# Patient Record
Sex: Female | Born: 1975 | Race: Black or African American | Hispanic: No | Marital: Single | Smoking: Never smoker
Health system: Southern US, Community
[De-identification: ages and names within clinical notes are randomized; demographics above are authoritative.]

## PROBLEM LIST (undated history)

## (undated) DIAGNOSIS — I10 Essential (primary) hypertension: Secondary | ICD-10-CM

## (undated) DIAGNOSIS — C541 Malignant neoplasm of endometrium: Secondary | ICD-10-CM

## (undated) HISTORY — PX: TONSILLECTOMY: SUR1361

## (undated) HISTORY — PX: ABDOMINAL HYSTERECTOMY: SHX81

---

## 2000-02-10 ENCOUNTER — Emergency Department (HOSPITAL_COMMUNITY): Admission: EM | Admit: 2000-02-10 | Discharge: 2000-02-10 | Payer: Self-pay | Admitting: Internal Medicine

## 2000-02-10 ENCOUNTER — Encounter: Payer: Self-pay | Admitting: Internal Medicine

## 2004-03-21 ENCOUNTER — Emergency Department: Payer: Self-pay | Admitting: Emergency Medicine

## 2006-01-01 ENCOUNTER — Emergency Department: Payer: Self-pay | Admitting: Emergency Medicine

## 2006-03-21 ENCOUNTER — Emergency Department: Payer: Self-pay | Admitting: General Practice

## 2008-09-11 ENCOUNTER — Emergency Department: Payer: Self-pay | Admitting: Unknown Physician Specialty

## 2010-08-02 ENCOUNTER — Emergency Department: Payer: Self-pay | Admitting: Unknown Physician Specialty

## 2015-02-23 ENCOUNTER — Emergency Department (HOSPITAL_COMMUNITY)
Admission: EM | Admit: 2015-02-23 | Discharge: 2015-02-23 | Disposition: A | Discharge status: Home / Self Care | Payer: BLUE CROSS/BLUE SHIELD | Attending: Emergency Medicine | Admitting: Emergency Medicine

## 2015-02-23 ENCOUNTER — Encounter (HOSPITAL_COMMUNITY): Payer: Self-pay | Admitting: Emergency Medicine

## 2015-02-23 DIAGNOSIS — X58XXXA Exposure to other specified factors, initial encounter: Secondary | ICD-10-CM | POA: Insufficient documentation

## 2015-02-23 DIAGNOSIS — I10 Essential (primary) hypertension: Secondary | ICD-10-CM | POA: Diagnosis not present

## 2015-02-23 DIAGNOSIS — T7840XA Allergy, unspecified, initial encounter: Secondary | ICD-10-CM | POA: Diagnosis not present

## 2015-02-23 DIAGNOSIS — Y998 Other external cause status: Secondary | ICD-10-CM | POA: Diagnosis not present

## 2015-02-23 DIAGNOSIS — R1110 Vomiting, unspecified: Secondary | ICD-10-CM | POA: Insufficient documentation

## 2015-02-23 DIAGNOSIS — Y9289 Other specified places as the place of occurrence of the external cause: Secondary | ICD-10-CM | POA: Diagnosis not present

## 2015-02-23 DIAGNOSIS — F419 Anxiety disorder, unspecified: Secondary | ICD-10-CM | POA: Insufficient documentation

## 2015-02-23 DIAGNOSIS — Y9389 Activity, other specified: Secondary | ICD-10-CM | POA: Insufficient documentation

## 2015-02-23 DIAGNOSIS — Z8542 Personal history of malignant neoplasm of other parts of uterus: Secondary | ICD-10-CM | POA: Insufficient documentation

## 2015-02-23 HISTORY — DX: Essential (primary) hypertension: I10

## 2015-02-23 HISTORY — DX: Malignant neoplasm of endometrium: C54.1

## 2015-02-23 MED ORDER — FAMOTIDINE IN NACL 20-0.9 MG/50ML-% IV SOLN
20.0000 mg | Freq: Once | INTRAVENOUS | Status: AC
  Administered 2015-02-23: 20 mg via INTRAVENOUS
  Filled 2015-02-23: qty 50

## 2015-02-23 MED ORDER — DIPHENHYDRAMINE HCL 50 MG/ML IJ SOLN
50.0000 mg | Freq: Once | INTRAMUSCULAR | Status: AC
  Administered 2015-02-23: 50 mg via INTRAMUSCULAR
  Filled 2015-02-23: qty 1

## 2015-02-23 MED ORDER — EPINEPHRINE 0.3 MG/0.3ML IJ SOAJ
0.3000 mg | Freq: Once | INTRAMUSCULAR | Status: AC
  Administered 2015-02-23: 0.3 mg via SUBCUTANEOUS
  Filled 2015-02-23: qty 0.3

## 2015-02-23 MED ORDER — DIPHENHYDRAMINE HCL 50 MG/ML IJ SOLN: 50.0000 mg | Freq: Once | INTRAMUSCULAR | Status: DC

## 2015-02-23 NOTE — ED Notes (Signed)
IV attempted x2 with US guidance. Unsuccessful because of patient inability to remain still for procedure. MD Steinl informed.

## 2015-02-23 NOTE — ED Notes (Signed)
Patient is allergic to jalapeno's, ate a jalapeno and feels "like her throat is closing up".  Patient is only able to speak with a whisper and having emesis.

## 2015-02-23 NOTE — ED Provider Notes (Addendum)
CSN: [id number]     Arrival date & time 02/23/15  1850 History   First MD Initiated Contact with Patient 02/23/15 1858     Chief Complaint  Patient presents with  . Allergic Reaction  . Emesis     (Consider location/radiation/quality/duration/timing/severity/associated sxs/prior Treatment) Patient is a 39 y.o. female presenting with allergic reaction and vomiting. The history is provided by the patient.  Allergic Reaction Presenting symptoms: no rash   Emesis Associated symptoms: no abdominal pain, no chills, no diarrhea, no headaches and no sore throat   Patient indicates she feels as though she is having an allergic reaction. States was eating at a [employer], and was eating a sauce and thinks there was jalapeno in the sauce, which she has been allergic to in the past.  Pt indicates shortly afterwards, felt as if swelling in throat. Pt is speaking in a whisper. No stridor or wheezing heard. No rash/hives or itching. No chest pain or sob.  Prior to onset symptoms, states was in normal state of health, has been asymptomatic recently. No throat pain or fevers. No uri c/o.       Past Medical History  Diagnosis Date  . Hypertension   . Endometrial cancer [facility])    Past Surgical History  Procedure Laterality Date  . Tonsillectomy    . Abdominal hysterectomy     No family history on file. Social History  Substance Use Topics  . Smoking status: Never Smoker   . Smokeless tobacco: None  . Alcohol Use: No   OB History    No data available     Review of Systems  Constitutional: Negative for fever and chills.  HENT: Negative for sore throat.   Eyes: Negative for redness.  Respiratory: Negative for cough and shortness of breath.   Cardiovascular: Negative for chest pain and palpitations.  Gastrointestinal: Positive for vomiting. Negative for abdominal pain and diarrhea.  Genitourinary: Negative for flank pain.  Musculoskeletal: Negative for back pain and neck  pain.  Skin: Negative for rash.  Neurological: Negative for syncope and headaches.  Hematological: Does not bruise/bleed easily.  Psychiatric/Behavioral: Negative for confusion.      Allergies  Review of patient's allergies indicates not on file.  Home Medications   Prior to Admission medications   Not on File   BP 178/95 mmHg  Pulse 105  Temp(Src) 98.3 F (36.8 C) (Oral)  Resp 14  SpO2 100% Physical Exam  Constitutional: She is oriented to person, place, and time. She appears well-developed and well-nourished. No distress.  HENT:  Mouth/Throat: Oropharynx is clear and moist.  No lip, tongue, or pharyngeal edema.   Eyes: Conjunctivae are normal. No scleral icterus.  Neck: Neck supple. No tracheal deviation present.  Cardiovascular: Normal rate, regular rhythm, normal heart sounds and intact distal pulses.  Exam reveals no gallop and no friction rub.   No murmur heard. Pulmonary/Chest: Effort normal and breath sounds normal. No respiratory distress.  Abdominal: Soft. Normal appearance and bowel sounds are normal. She exhibits no distension. There is no tenderness.  obese  Genitourinary:  No cva tenderness  Musculoskeletal: She exhibits no edema.  Neurological: She is alert and oriented to person, place, and time.  Skin: Skin is warm and dry. No rash noted.  Psychiatric:  Mildly anxious  Nursing note and vitals reviewed.   ED Course  Procedures (including critical care time) Labs Review     I have personally reviewed and evaluated these images and lab results as part  of my medical decision-making.   EKG Interpretation   Date/Time:  Monday February 23 2015 19:02:15 EST Ventricular Rate:  109 PR Interval:  172 QRS Duration: 85 QT Interval:  335 QTC Calculation: 451 R Axis:   5 Text Interpretation:  Sinus tachycardia No previous tracing Confirmed by  Ashok Cordia  MD, Lennette Bihari ([id number]) on 02/23/2015 7:07:45 PM      MDM   Reviewed nursing notes and prior charts  for additional history.   Benadryl iv. pepcid iv.  Pt indicates allergy reaction to steroids/pred.   Nurses/staff trying to establish iv access. Given sense throat swelling will give epi subcut/IM.    Recheck - pts symptoms completely resolved.  No wheezing, no stridor, no whispering.   Pt drinking fluids. No c/o, remains asymptomatic.  Pt currently appears stable for d/c.      Lajean Saver, MD 02/23/15 2127

## 2015-02-23 NOTE — Discharge Instructions (Signed)
It was our pleasure to provide your ER care today - we hope that you feel better.  Rest. Drink adequate fluids.  In the event of severe allergic reaction (i.e. Throat closing, unable to breath, about to faint) - use your epipen, take benadryl 50 mg, and seek medical attention immediately.  Follow up with primary care doctor in the next few days.  Return to ER if worse, symptoms recur, trouble breathing, throat closing, other concern.  You were given medication in the ER that causes drowsiness, no driving for the next 4 hours.        Anaphylactic Reaction An anaphylactic reaction is a sudden, severe allergic reaction that involves the whole body. It can be life threatening. A hospital stay is often required. People with asthma, eczema, or hay fever are slightly more likely to have an anaphylactic reaction. CAUSES  An anaphylactic reaction may be caused by anything to which you are allergic. After being exposed to the allergic substance, your immune system becomes sensitized to it. When you are exposed to that allergic substance again, an allergic reaction can occur. Common causes of an anaphylactic reaction include:  Medicines.  Foods, especially peanuts, wheat, shellfish, milk, and eggs.  Insect bites or stings.  Blood products.  Chemicals, such as dyes, latex, and contrast material used for imaging tests. SYMPTOMS  When an allergic reaction occurs, the body releases histamine and other substances. These substances cause symptoms such as tightening of the airway. Symptoms often develop within seconds or minutes of exposure. Symptoms may include:  Skin rash or hives.  Itching.  Chest tightness.  Swelling of the eyes, tongue, or lips.  Trouble breathing or swallowing.  Lightheadedness or fainting.  Anxiety or confusion.  Stomach pains, vomiting, or diarrhea.  Nasal congestion.  A fast or irregular heartbeat (palpitations). DIAGNOSIS  Diagnosis is based on your  history of recent exposure to allergic substances, your symptoms, and a physical exam. Your caregiver may also perform blood or urine tests to confirm the diagnosis. TREATMENT  Epinephrine medicine is the main treatment for an anaphylactic reaction. Other medicines that may be used for treatment include antihistamines, steroids, and albuterol. In severe cases, fluids and medicine to support blood pressure may be given through an intravenous line (IV). Even if you improve after treatment, you need to be observed to make sure your condition does not get worse. This may require a stay in the hospital. [place] a medical alert bracelet or necklace stating your allergy.  You and your family must learn how to use an anaphylaxis kit or give an epinephrine injection to temporarily treat an emergency allergic reaction. Always carry your epinephrine injection or anaphylaxis kit with you. This can be lifesaving if you have a severe reaction.  Do not drive or perform tasks after treatment until the medicines used to treat your reaction have worn off, or until your caregiver says it is okay.  If you have hives or a rash:  Take medicines as directed by your caregiver.  You may use an over-the-counter antihistamine (diphenhydramine) as needed.  Apply cold compresses to the skin or take baths in cool water. Avoid hot baths or showers. SEEK MEDICAL CARE IF:   You develop symptoms of an allergic reaction to a new substance. Symptoms may start right away or minutes later.  You develop a rash, hives, or itching.  You develop new symptoms. SEEK IMMEDIATE MEDICAL CARE IF:   You have swelling of the mouth, difficulty  breathing, or wheezing.  You have a tight feeling in your chest or throat.  You develop hives, swelling, or itching all over your body.  You develop severe vomiting or diarrhea.  You feel faint or pass out. This is an emergency. Use your epinephrine injection or  anaphylaxis kit as you have been instructed. Call your local emergency services (911 in [place] Even if you improve after the injection, you need to be examined at a hospital emergency department. MAKE SURE YOU:   Understand these instructions.  Will watch your condition.  Will get help right away if you are not doing well or get worse.   This information is not intended to replace advice given to you by your health care provider. Make sure you discuss any questions you have with your health care provider.   Document Released: 02/21/2005 Document Revised: 02/26/2013 Document Reviewed: 09/03/2014 Elsevier Interactive Patient Education [employer].

## 2015-02-23 NOTE — ED Notes (Signed)
RN at bedside for IV ultrasound.

## 2016-01-13 ENCOUNTER — Telehealth: Payer: Self-pay | Admitting: Radiology

## 2016-01-13 NOTE — Telephone Encounter (Signed)
Called patient to advise rx can be picked up she can not fill until 01/18/16

## 2016-01-13 NOTE — Telephone Encounter (Signed)
Called patient to advise ready for pick up

## 2017-10-05 ENCOUNTER — Emergency Department: Payer: BC Managed Care – PPO

## 2017-10-05 ENCOUNTER — Emergency Department
Admission: EM | Admit: 2017-10-05 | Discharge: 2017-10-05 | Disposition: A | Discharge status: Home / Self Care | Payer: BC Managed Care – PPO | Attending: Emergency Medicine | Admitting: Emergency Medicine

## 2017-10-05 ENCOUNTER — Other Ambulatory Visit: Payer: Self-pay

## 2017-10-05 ENCOUNTER — Encounter: Payer: Self-pay | Admitting: *Deleted

## 2017-10-05 DIAGNOSIS — M545 Low back pain, unspecified: Secondary | ICD-10-CM

## 2017-10-05 DIAGNOSIS — I10 Essential (primary) hypertension: Secondary | ICD-10-CM | POA: Insufficient documentation

## 2017-10-05 DIAGNOSIS — R0600 Dyspnea, unspecified: Secondary | ICD-10-CM | POA: Diagnosis not present

## 2017-10-05 LAB — URINALYSIS, COMPLETE (UACMP) WITH MICROSCOPIC
BACTERIA UA: NONE SEEN
BILIRUBIN URINE: NEGATIVE
GLUCOSE, UA: NEGATIVE mg/dL
HGB URINE DIPSTICK: NEGATIVE
Ketones, ur: NEGATIVE mg/dL
LEUKOCYTES UA: NEGATIVE
NITRITE: NEGATIVE
PH: 5 (ref 5.0–8.0)
Protein, ur: NEGATIVE mg/dL
Specific Gravity, Urine: 1.025 (ref 1.005–1.030)

## 2017-10-05 LAB — BASIC METABOLIC PANEL
ANION GAP: 6 (ref 5–15)
BUN: 16 mg/dL (ref 6–20)
CALCIUM: 9.2 mg/dL (ref 8.9–10.3)
CHLORIDE: 108 mmol/L (ref 98–111)
CO2: 28 mmol/L (ref 22–32)
CREATININE: 0.81 mg/dL (ref 0.44–1.00)
GFR calc non Af Amer: >60 mL/min (ref >60)
GLUCOSE: 151 mg/dL — AB (ref 70–99)
Potassium: 4.1 mmol/L (ref 3.5–5.1)
Sodium: 142 mmol/L (ref 135–145)

## 2017-10-05 LAB — CBC
HEMATOCRIT: 37.8 % (ref 35.0–47.0)
Hemoglobin: 12.3 g/dL (ref 12.0–16.0)
MCH: 26.2 pg (ref 26.0–34.0)
MCHC: 32.4 g/dL (ref 32.0–36.0)
MCV: 80.6 fL (ref 80.0–100.0)
PLATELETS: 316 10*3/uL (ref 150–440)
RBC: 4.69 MIL/uL (ref 3.80–5.20)
RDW: 15.5 % — AB (ref 11.5–14.5)
WBC: 7.1 10*3/uL (ref 3.6–11.0)

## 2017-10-05 LAB — BRAIN NATRIURETIC PEPTIDE: B Natriuretic Peptide: 5 pg/mL (ref 0.0–100.0)

## 2017-10-05 LAB — TROPONIN I: Troponin I: <0.03 ng/mL (ref <0.03)

## 2017-10-05 LAB — FIBRIN DERIVATIVES D-DIMER (ARMC ONLY): Fibrin derivatives D-dimer (ARMC): 448.27 ng/mL (FEU) (ref 0.00–499.00)

## 2017-10-05 MED ORDER — IPRATROPIUM-ALBUTEROL 0.5-2.5 (3) MG/3ML IN SOLN
3.0000 mL | Freq: Once | RESPIRATORY_TRACT | Status: AC
  Administered 2017-10-05: 3 mL via RESPIRATORY_TRACT
  Filled 2017-10-05: qty 3

## 2017-10-05 MED ORDER — ALBUTEROL SULFATE HFA 108 (90 BASE) MCG/ACT IN AERS: 2.0000 | INHALATION_SPRAY | Freq: Four times a day (QID) | RESPIRATORY_TRACT | 2 refills | Status: AC | PRN

## 2017-10-05 MED ORDER — SPACER/AERO CHAMBER MOUTHPIECE MISC: 1.0000 | Freq: Once | 0 refills | Status: AC

## 2017-10-05 NOTE — ED Triage Notes (Signed)
Pt to ED reporting lower back pain for the past two weeks without known injury. Pt also reporting a non congested cough and SOB for the past two days. Increased fatigue. No fevers. No CP, lightheadedness or dizziness.

## 2017-10-05 NOTE — Discharge Instructions (Addendum)
Please follow-up with your regular doctor.  Please try to lose weight as we discussed.  Please use the albuterol inhaler 2 puffs 4 times a day with a spacer as needed.  Return for any problems especially if the shortness of breath it seems to get worse or come back.  You can use Tylenol or Advil 3 of the over-the-counter pills 3 times a day with food for 3 to 4 days if needed for your back.

## 2017-10-05 NOTE — ED Provider Notes (Signed)
[facility] Emergency Department Provider Note   ____________________________________________   First MD Initiated Contact with Patient 10/05/17 1219     (approximate)  I have reviewed the triage vital signs and the nursing notes.   HISTORY  Chief Complaint Shortness of Breath and Back Pain   HPI Angelica Gonzalez is a 42 y.o. female patient reports achy low back for about 2 weeks.  She is also having an dry cough and feels somewhat short of breath and that she has to take deeper breaths to feel like she is getting enough air.  She has gained 30 pounds..  Other past history is she had a hysterectomy for endometrial cancer in 2008   Past Medical History:  Diagnosis Date  . Endometrial cancer ([place])   . Hypertension     There are no active problems to display for this patient.   Past Surgical History:  Procedure Laterality Date  . ABDOMINAL HYSTERECTOMY    . TONSILLECTOMY      Prior to Admission medications   Medication Sig Start Date End Date Taking? Authorizing Provider  albuterol (PROVENTIL HFA;VENTOLIN HFA) 108 (90 Base) MCG/ACT inhaler Inhale 2 puffs into the lungs every 6 (six) hours as needed for wheezing or shortness of breath. 10/05/17   Nena Polio, MD  Spacer/Aero Chamber Mouthpiece MISC 1 Device by Does not apply route once for 1 dose. Use spacer as directed with your inhaler. 10/05/17 10/05/17  Nena Polio, MD    Allergies Eggs or egg-derived products; Amoxicillin-pot clavulanate; Prednisone; Shellfish-derived products; and Peanut-containing drug products  History reviewed. No pertinent family history.  Social History Social History   Tobacco Use  . Smoking status: Never Smoker  . Smokeless tobacco: Never Used  Substance Use Topics  . Alcohol use: No  . Drug use: No    Review of Systems   Constitutional: No fever/chills Eyes: No visual changes. ENT: No sore throat. Cardiovascular: Denies chest pain. Respiratory: Denies  shortness of breath. Gastrointestinal: No abdominal pain.  No nausea, no vomiting.  No diarrhea.  No constipation. Genitourinary: Negative for dysuria. Musculoskeletal:  back pain. Skin: Negative for rash. Neurological: Negative for headaches, focal weakness   ____________________________________________   PHYSICAL EXAM:  VITAL SIGNS: ED Triage Vitals  Enc Vitals Group     BP 10/05/17 1109 (!) 143/102     Pulse Rate 10/05/17 1109 93     Resp 10/05/17 1109 16     Temp 10/05/17 1109 98.6 F (37 C)     Temp Source 10/05/17 1109 Oral     SpO2 10/05/17 1109 97 %     Weight 10/05/17 1145 272 lb 12.8 oz (123.7 kg)     Height 10/05/17 1110 5\' 3"  (1.6 m)     Head Circumference --      Peak Flow --      Pain Score 10/05/17 1110 9     Pain Loc --      Pain Edu? --      Excl. in [place]? --     Constitutional: Alert and oriented. Well appearing and in no acute distress. Eyes: Conjunctivae are normal. PERRL. EOMI. Head: Atraumatic. Nose: No congestion/rhinnorhea. Mouth/Throat: Mucous membranes are moist.  Oropharynx non-erythematous. Neck: No stridor. Cardiovascular: Normal rate, regular rhythm. Grossly normal heart sounds.  Good peripheral circulation. Respiratory: Normal respiratory effort.  No retractions. Lungs CTAB. Gastrointestinal: Soft and nontender. No distention. No abdominal bruits. No CVA tenderness. Musculoskeletal: No lower extremity tenderness nor edema.  Back  is tender to palpation over approximately L1-L3 Neurologic:  Normal speech and language. No gross focal neurologic deficits are appreciated. No gait instability. Skin:  Skin is warm, dry and intact. No rash noted. Psychiatric: Mood and affect are normal. Speech and behavior are normal.  ____________________________________________   LABS (all labs ordered are listed, but only abnormal results are displayed)  Labs Reviewed  BASIC METABOLIC PANEL - Abnormal; Notable for the following components:      Result Value    Glucose, Bld 151 (*)    All other components within normal limits  CBC - Abnormal; Notable for the following components:   RDW 15.5 (*)    All other components within normal limits  URINALYSIS, COMPLETE (UACMP) WITH MICROSCOPIC - Abnormal; Notable for the following components:   Color, Urine YELLOW (*)    APPearance CLOUDY (*)    All other components within normal limits  TROPONIN I  FIBRIN DERIVATIVES D-DIMER ([facility] ONLY)  BRAIN NATRIURETIC PEPTIDE   ____________________________________________  EKG   ____________________________________________  RADIOLOGY  ED MD interpretation: Chest x-ray shows no acute disease lumbar x-rays show minimal DJD.  I reviewed both sets of films  Official radiology report(s): Dg Chest 2 View  Result Date: 10/05/2017 CLINICAL DATA:  Cough.  Shortness of breath.  Fatigue. EXAM: CHEST - 2 VIEW COMPARISON:  No recent prior. FINDINGS: Mediastinum and hilar structures normal. Lungs are clear. No pleural effusion or pneumothorax. Heart size normal. No acute bony abnormality. IMPRESSION: No acute cardiopulmonary disease. Electronically Signed   By: Marcello Moores  Register   On: 10/05/2017 11:42   Dg Lumbar Spine Complete  Result Date: 10/05/2017 CLINICAL DATA:  Low back pain. EXAM: LUMBAR SPINE - COMPLETE 4+ VIEW COMPARISON:  None. FINDINGS: Five non rib-bearing lumbar type vertebral bodies are present. Vertebral body heights and alignment are maintained. AP alignment is anatomic. Mild facet degenerative changes are present at L4-5 bilaterally. Soft tissues are otherwise unremarkable. IMPRESSION: 1. Minimal degenerative changes at L4-5. 2. Otherwise negative lumbar spine radiographs. Electronically Signed   By: San Morelle M.D.   On: 10/05/2017 13:57    ____________________________________________   PROCEDURES  Procedure(s) performed:  Procedures  Critical Care performed:   ____________________________________________   INITIAL IMPRESSION /  ASSESSMENT AND PLAN / ED COURSE Patient reports she feels better after inhaler her lungs which were clear initially sound like she is moving air even better now.  I will give her an inhaler with a spacer which I discussed the use of with her.  She will follow-up with her doctor.  I discussed with her trying to lose weight as well.  If need be she can use Tylenol or Advil for backache.     Clinical Course as of Oct 06 1547  Thu Oct 05, 2017  1342 Fibrin derivatives D-Dimer [PM]    Clinical Course User Index [PM] Nena Polio, MD     ____________________________________________   FINAL CLINICAL IMPRESSION(S) / ED DIAGNOSES  Final diagnoses:  Midline low back pain without sciatica, unspecified chronicity  Dyspnea, unspecified type     ED Discharge Orders        Ordered    albuterol (PROVENTIL HFA;VENTOLIN HFA) 108 (90 Base) MCG/ACT inhaler  Every 6 hours PRN     10/05/17 1549    Spacer/Aero Chamber Mouthpiece MISC   Once     10/05/17 1549       Note:  This document was prepared using Dragon voice recognition software and may include unintentional dictation errors.  Nena Polio, MD 10/05/17 [phone]

## 2017-10-05 NOTE — ED Notes (Signed)
Patient transported to X-ray 

## 2017-10-05 NOTE — ED Notes (Signed)
Informed RN that patient has been roomed and is ready for evaluation.  Patient in NAD at this time and call bell placed within reach.   

## 2017-12-07 ENCOUNTER — Encounter: Payer: Self-pay | Admitting: Emergency Medicine

## 2017-12-07 ENCOUNTER — Other Ambulatory Visit: Payer: Self-pay

## 2017-12-07 ENCOUNTER — Emergency Department
Admission: EM | Admit: 2017-12-07 | Discharge: 2017-12-07 | Disposition: A | Discharge status: Home / Self Care | Payer: BC Managed Care – PPO | Attending: Emergency Medicine | Admitting: Emergency Medicine

## 2017-12-07 DIAGNOSIS — Z79899 Other long term (current) drug therapy: Secondary | ICD-10-CM | POA: Diagnosis not present

## 2017-12-07 DIAGNOSIS — G44219 Episodic tension-type headache, not intractable: Secondary | ICD-10-CM | POA: Diagnosis not present

## 2017-12-07 DIAGNOSIS — R519 Headache, unspecified: Secondary | ICD-10-CM

## 2017-12-07 DIAGNOSIS — R51 Headache: Secondary | ICD-10-CM

## 2017-12-07 DIAGNOSIS — I10 Essential (primary) hypertension: Secondary | ICD-10-CM

## 2017-12-07 DIAGNOSIS — Z91010 Allergy to peanuts: Secondary | ICD-10-CM | POA: Diagnosis not present

## 2017-12-07 LAB — COMPREHENSIVE METABOLIC PANEL
ALBUMIN: 3.7 g/dL (ref 3.5–5.0)
ALK PHOS: 66 U/L (ref 38–126)
ALT: 14 U/L (ref 0–44)
AST: 20 U/L (ref 15–41)
Anion gap: 9 (ref 5–15)
BILIRUBIN TOTAL: 0.7 mg/dL (ref 0.3–1.2)
BUN: 13 mg/dL (ref 6–20)
CALCIUM: 9.3 mg/dL (ref 8.9–10.3)
CO2: 27 mmol/L (ref 22–32)
CREATININE: 0.64 mg/dL (ref 0.44–1.00)
Chloride: 103 mmol/L (ref 98–111)
GFR calc Af Amer: >60 mL/min (ref >60)
GFR calc non Af Amer: >60 mL/min (ref >60)
Glucose, Bld: 209 mg/dL — ABNORMAL HIGH (ref 70–99)
Potassium: 4.1 mmol/L (ref 3.5–5.1)
SODIUM: 139 mmol/L (ref 135–145)
Total Protein: 7.6 g/dL (ref 6.5–8.1)

## 2017-12-07 LAB — CBC
HEMATOCRIT: 35.1 % (ref 35.0–47.0)
HEMOGLOBIN: 12 g/dL (ref 12.0–16.0)
MCH: 28 pg (ref 26.0–34.0)
MCHC: 34.2 g/dL (ref 32.0–36.0)
MCV: 81.7 fL (ref 80.0–100.0)
Platelets: 267 10*3/uL (ref 150–440)
RBC: 4.3 MIL/uL (ref 3.80–5.20)
RDW: 15.6 % — ABNORMAL HIGH (ref 11.5–14.5)
WBC: 6.3 10*3/uL (ref 3.6–11.0)

## 2017-12-07 LAB — TROPONIN I

## 2017-12-07 NOTE — ED Notes (Signed)
Patient to ED via [facility] with complaint of HTN.  BP 157/117, CBG- 86, pulse ox 100%.  Alert and oriented.  States she called MD who told her to come to ED.

## 2017-12-07 NOTE — ED Notes (Signed)
Unable to esign, pt verbalized understanding of d/c instructions.

## 2017-12-07 NOTE — ED Provider Notes (Signed)
[facility] Emergency Department Provider Note  Time seen: 12:09 PM  I have reviewed the triage vital signs and the nursing notes.   HISTORY  Chief Complaint Hypertension and Headache    HPI Angelica Gonzalez is a 42 y.o. female with a past medical history of hypertension presents to the emergency department for hypertension.  According to the patient she woke this morning with some blurred vision and a headache, checked her blood pressure nose very elevated 170s over 150s per patient.  She states she just purchased a wrist blood pressure cuff this week.  Patient denies any chest pain or trouble breathing.  Denies abdominal pain.  Largely negative review of systems.   Past Medical History:  Diagnosis Date  . Endometrial cancer ([place])   . Hypertension     There are no active problems to display for this patient.   Past Surgical History:  Procedure Laterality Date  . ABDOMINAL HYSTERECTOMY    . TONSILLECTOMY      Prior to Admission medications   Medication Sig Start Date End Date Taking? Authorizing Provider  albuterol (PROVENTIL HFA;VENTOLIN HFA) 108 (90 Base) MCG/ACT inhaler Inhale 2 puffs into the lungs every 6 (six) hours as needed for wheezing or shortness of breath. 10/05/17   Nena Polio, MD    Allergies  Allergen Reactions  . Eggs Or Egg-Derived Products Nausea And Vomiting  . Amoxicillin-Pot Clavulanate     Other reaction(s): RASH  . Prednisone     Other reaction(s): ANAPHYLAXIS  . Shellfish-Derived Products     Other reaction(s): SWELLING/EDEMA  . Peanut-Containing Drug Products Nausea And Vomiting    No family history on file.  Social History Social History   Tobacco Use  . Smoking status: Never Smoker  . Smokeless tobacco: Never Used  Substance Use Topics  . Alcohol use: No  . Drug use: No    Review of Systems Constitutional: Negative for fever. Eyes: Blurred vision this morning, now resolved Cardiovascular: Negative for  chest pain. Respiratory: Negative for shortness of breath. Gastrointestinal: Negative for abdominal pain Genitourinary: Negative for urinary compaints Musculoskeletal: Negative for musculoskeletal complaints Skin: Negative for skin complaints  Neurological: Moderate headache.  Denies any focal weakness or numbness. All other ROS negative  ____________________________________________   PHYSICAL EXAM:  VITAL SIGNS: ED Triage Vitals  Enc Vitals Group     BP 12/07/17 1015 (!) 151/92     Pulse Rate 12/07/17 1015 87     Resp 12/07/17 1015 20     Temp 12/07/17 1015 98.3 F (36.8 C)     Temp Source 12/07/17 1015 Oral     SpO2 12/07/17 1015 100 %     Weight --      Height 12/07/17 1016 5\' 3"  (1.6 m)     Head Circumference --      Peak Flow --      Pain Score 12/07/17 1016 10     Pain Loc --      Pain Edu? --      Excl. in [place]? --     Constitutional: Alert and oriented. Well appearing and in no distress. Eyes: Normal exam ENT   Head: Normocephalic and atraumatic.   Mouth/Throat: Mucous membranes are moist. Cardiovascular: Normal rate, regular rhythm. No murmur Respiratory: Normal respiratory effort without tachypnea nor retractions. Breath sounds are clear Gastrointestinal: Soft and nontender. No distention.  Musculoskeletal: Nontender with normal range of motion in all extremities.  Neurologic:  Normal speech and language. No gross  focal neurologic deficits.  Cranial nerves intact.  Equal grip strength.  No pronator drift. Skin:  Skin is warm, dry and intact.  Psychiatric: Mood and affect are normal. Speech and behavior are normal.   ____________________________________________   INITIAL IMPRESSION / ASSESSMENT AND PLAN / ED COURSE  Pertinent labs & imaging results that were available during my care of the patient were reviewed by me and considered in my medical decision making (see chart for details).  Patient presents to the emergency department with complaints of  headache blurred vision found to be hypertensive at home, called her doctor who referred her to the emergency department.  Here the patient overall appears very well, no distress continues to state mild headache and had blurred vision this morning which is since resolved.  Patient's blood pressure currently 151/92, without intervention.  Patient takes losartan and amlodipine at home for blood pressure.  We will check labs including chemistry and troponin.  Overall the patient appears very well, negative stroke screen.    Patient is asking to be discharged home prior to blood work results.  We will discharge the patient home she will follow-up with her doctor.    ____________________________________________   FINAL CLINICAL IMPRESSION(S) / ED DIAGNOSES  Hypertension    Harvest Dark, MD 12/07/17 1213

## 2017-12-07 NOTE — ED Triage Notes (Signed)
Pt reports this am her BP was 197/157 and she had a HA. Pt reports in the EMS truck it was 175/115. Pt reports hx of HTN and takes meds for it. States called her MD and was told to come to the ED. Pt denies any other sx's.

## 2017-12-07 NOTE — Discharge Instructions (Addendum)
Please continue to take your blood pressure medications at home, as prescribed by your doctor.  Return to the emergency department for any significant worsening of your headache, any weakness or numbness of any arm or leg confusion, slurred speech or development of chest pain.  Otherwise please follow-up with your doctor within the next several days for recheck/reevaluation.

## 2019-12-27 ENCOUNTER — Other Ambulatory Visit: Payer: Self-pay

## 2020-01-14 ENCOUNTER — Emergency Department: Payer: No Typology Code available for payment source

## 2020-01-14 ENCOUNTER — Emergency Department
Admission: EM | Admit: 2020-01-14 | Discharge: 2020-01-14 | Disposition: A | Discharge status: Home / Self Care | Payer: No Typology Code available for payment source | Attending: Emergency Medicine | Admitting: Emergency Medicine

## 2020-01-14 ENCOUNTER — Other Ambulatory Visit: Payer: Self-pay

## 2020-01-14 ENCOUNTER — Encounter: Payer: Self-pay | Admitting: *Deleted

## 2020-01-14 DIAGNOSIS — I10 Essential (primary) hypertension: Secondary | ICD-10-CM | POA: Diagnosis not present

## 2020-01-14 DIAGNOSIS — S93401A Sprain of unspecified ligament of right ankle, initial encounter: Secondary | ICD-10-CM | POA: Diagnosis not present

## 2020-01-14 DIAGNOSIS — M25571 Pain in right ankle and joints of right foot: Secondary | ICD-10-CM | POA: Diagnosis present

## 2020-01-14 MED ORDER — TRAMADOL HCL 50 MG PO TABS
50.0000 mg | ORAL_TABLET | Freq: Once | ORAL | Status: AC
  Administered 2020-01-14: 50 mg via ORAL
  Filled 2020-01-14: qty 1

## 2020-01-14 MED ORDER — IBUPROFEN 600 MG PO TABS: 600.0000 mg | ORAL_TABLET | Freq: Three times a day (TID) | ORAL | 0 refills | Status: DC | PRN

## 2020-01-14 MED ORDER — TRAMADOL HCL 50 MG PO TABS
50.0000 mg | ORAL_TABLET | Freq: Four times a day (QID) | ORAL | 0 refills | Status: DC | PRN
Start: 2020-01-14 — End: 2021-03-28

## 2020-01-14 MED ORDER — CYCLOBENZAPRINE HCL 10 MG PO TABS: 10.0000 mg | ORAL_TABLET | Freq: Three times a day (TID) | ORAL | 0 refills | Status: DC | PRN

## 2020-01-14 MED ORDER — CYCLOBENZAPRINE HCL 10 MG PO TABS
10.0000 mg | ORAL_TABLET | Freq: Once | ORAL | Status: AC
  Administered 2020-01-14: 10 mg via ORAL
  Filled 2020-01-14: qty 1

## 2020-01-14 MED ORDER — IBUPROFEN 600 MG PO TABS
600.0000 mg | ORAL_TABLET | Freq: Once | ORAL | Status: DC
  Filled 2020-01-14: qty 1
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
## 2020-01-14 NOTE — ED Notes (Signed)
Provider at bedside

## 2020-01-14 NOTE — Discharge Instructions (Signed)
Wear splint for 2 to 3 days as needed.  Follow discharge care instruction take medication as directed.

## 2020-10-27 ENCOUNTER — Encounter: Payer: Self-pay | Admitting: Radiology

## 2020-10-27 ENCOUNTER — Emergency Department: Payer: BLUE CROSS/BLUE SHIELD

## 2020-10-27 ENCOUNTER — Emergency Department
Admission: EM | Admit: 2020-10-27 | Discharge: 2020-10-27 | Disposition: A | Discharge status: Home / Self Care | Payer: BLUE CROSS/BLUE SHIELD | Attending: Emergency Medicine | Admitting: Emergency Medicine

## 2020-10-27 ENCOUNTER — Other Ambulatory Visit: Payer: Self-pay

## 2020-10-27 DIAGNOSIS — Z8544 Personal history of malignant neoplasm of other female genital organs: Secondary | ICD-10-CM | POA: Insufficient documentation

## 2020-10-27 DIAGNOSIS — U071 COVID-19: Secondary | ICD-10-CM | POA: Insufficient documentation

## 2020-10-27 DIAGNOSIS — Z28310 Unvaccinated for covid-19: Secondary | ICD-10-CM | POA: Diagnosis not present

## 2020-10-27 DIAGNOSIS — Z91010 Allergy to peanuts: Secondary | ICD-10-CM | POA: Insufficient documentation

## 2020-10-27 DIAGNOSIS — I10 Essential (primary) hypertension: Secondary | ICD-10-CM | POA: Diagnosis not present

## 2020-10-27 DIAGNOSIS — R059 Cough, unspecified: Secondary | ICD-10-CM | POA: Diagnosis present

## 2020-10-27 MED ORDER — KETOROLAC TROMETHAMINE 30 MG/ML IJ SOLN
60.0000 mg | Freq: Once | INTRAMUSCULAR | Status: AC
  Administered 2020-10-27: 60 mg via INTRAMUSCULAR
  Filled 2020-10-27: qty 2

## 2020-10-27 MED ORDER — HYDROCOD POLST-CPM POLST ER 10-8 MG/5ML PO SUER: 5.0000 mL | Freq: Two times a day (BID) | ORAL | 0 refills | Status: DC | PRN

## 2020-10-27 NOTE — ED Triage Notes (Addendum)
Pt tested positive for Covid 8 days ago but here for persistent cough and throat irritation due to cough. Pt taking tessalon Perles without relief.

## 2020-10-27 NOTE — ED Provider Notes (Addendum)
She is  [facility] Emergency Department Provider Note  ____________________________________________   Event Date/Time   First MD Initiated Contact with Patient 10/27/20 0215     (approximate)  I have reviewed the triage vital signs and the nursing notes.   HISTORY  Chief Complaint Cough    HPI Angelica Gonzalez is a 45 y.o. female with history of hypertension, obesity who presents to the emergency department with dry, nonproductive cough that is keeping her from sleeping and causing her to have pain in her throat and chest from coughing so much.  States she tested positive for COVID-19 8 days ago.  She has not had any fever in 2 days.  No nausea or vomiting.  Has had some diarrhea.  Taking Tessalon Perles, over-the-counter Robitussin and Dimetapp and "nothing is working".  States she feels short of breath because she is coughing so much.  No lower extremity swelling or pain.  She has not been vaccinated against COVID-19.  States she had monoclonal antibodies on Wednesday the 17th.     Past Medical History:  Diagnosis Date   Endometrial cancer ([place])    Hypertension     There are no problems to display for this patient.   Past Surgical History:  Procedure Laterality Date   ABDOMINAL HYSTERECTOMY     TONSILLECTOMY      Prior to Admission medications   Medication Sig Start Date End Date Taking? Authorizing Provider  chlorpheniramine-HYDROcodone (TUSSIONEX PENNKINETIC ER) 10-8 MG/5ML SUER Take 5 mLs by mouth every 12 (twelve) hours as needed for cough. 10/27/20  Yes Gerrie Castiglia, Cyril Mourning N, DO  albuterol (PROVENTIL HFA;VENTOLIN HFA) 108 (90 Base) MCG/ACT inhaler Inhale 2 puffs into the lungs every 6 (six) hours as needed for wheezing or shortness of breath. 10/05/17   Nena Polio, MD  cyclobenzaprine (FLEXERIL) 10 MG tablet Take 1 tablet (10 mg total) by mouth 3 (three) times daily as needed. 01/14/20   Sable Feil, PA-C  traMADol (ULTRAM) 50 MG tablet Take 1  tablet (50 mg total) by mouth every 6 (six) hours as needed for moderate pain. 01/14/20   Sable Feil, PA-C    Allergies Eggs or egg-derived products, Amoxicillin-pot clavulanate, Prednisone, Shellfish-derived products, and Peanut-containing drug products  No family history on file.  Social History Social History   Tobacco Use   Smoking status: Never   Smokeless tobacco: Never  Substance Use Topics   Alcohol use: No   Drug use: No    Review of Systems Constitutional: No fever. Eyes: No visual changes. ENT: No sore throat. Cardiovascular: + chest pain only with coughing Respiratory: + shortness of breath with coughing Gastrointestinal: No nausea, vomiting, diarrhea. Genitourinary: Negative for dysuria. Musculoskeletal: Negative for back pain. Skin: Negative for rash. Neurological: Negative for focal weakness or numbness.  ____________________________________________   PHYSICAL EXAM:  VITAL SIGNS: ED Triage Vitals [10/27/20 0028]  Enc Vitals Group     BP 105/70     Pulse Rate 87     Resp 20     Temp 98.8 F (37.1 C)     Temp Source Oral     SpO2 100 %     Weight 270 lb (122.5 kg)     Height '5\' 3"'$  (1.6 m)     Head Circumference      Peak Flow      Pain Score 10     Pain Loc      Pain Edu?      Excl. in  [place]?    CONSTITUTIONAL: Alert and oriented and responds appropriately to questions. Well-appearing; well-nourished, afebrile, nontoxic, obese HEAD: Normocephalic EYES: Conjunctivae clear, pupils appear equal, EOM appear intact ENT: normal nose; moist mucous membranes, normal phonation NECK: Supple, normal ROM CARD: RRR; S1 and S2 appreciated; no murmurs, no clicks, no rubs, no gallops RESP: Normal chest excursion without splinting or tachypnea; breath sounds clear and equal bilaterally; no wheezes, no rhonchi, no rales, no hypoxia or respiratory distress, speaking full sentences, no hypoxia at rest or with ambulation, no increased work of breathing ABD/GI:  Normal bowel sounds; non-distended; soft, non-tender, no rebound, no guarding, no peritoneal signs, no hepatosplenomegaly BACK: The back appears normal EXT: Normal ROM in all joints; no deformity noted, no edema; no cyanosis, no calf tenderness or calf swelling SKIN: Normal color for age and race; warm; no rash on exposed skin NEURO: Moves all extremities equally PSYCH: The patient's mood and manner are appropriate.  ____________________________________________   LABS (all labs ordered are listed, but only abnormal results are displayed)  Labs Reviewed - No data to display ____________________________________________  EKG   ____________________________________________  RADIOLOGY I, Vickki Igou, personally viewed and evaluated these images (plain radiographs) as part of my medical decision making, as well as reviewing the written report by the radiologist.  ED MD interpretation: Chest x-ray clear.  Official radiology report(s): DG Chest 2 View  Result Date: 10/27/2020 CLINICAL DATA:  Cough EXAM: CHEST - 2 VIEW COMPARISON:  10/05/2017 FINDINGS: The heart size and mediastinal contours are within normal limits. Both lungs are clear. The visualized skeletal structures are unremarkable. IMPRESSION: No active cardiopulmonary disease. Electronically Signed   By: Fidela Salisbury M.D.   On: 10/27/2020 00:52    ____________________________________________   PROCEDURES  Procedure(s) performed (including Critical Care):  Procedures    ____________________________________________   INITIAL IMPRESSION / ASSESSMENT AND PLAN / ED COURSE  As part of my medical decision making, I reviewed the following data within the [facility] notes reviewed and incorporated, Old chart reviewed, Radiograph reviewed , and Notes from prior ED visits         Patient here with cough causing chest discomfort and throat pain and shortness of breath.  Her lungs are completely  clear to auscultation here and she has no increased work of breathing, respiratory distress.  Normal oxygen saturation at rest and with ambulation.  She is requesting something to help with her cough so that she can get sleep.  Will provide with prescription for Tussionex.  I also recommend alternating Tylenol, Motrin for her throat and chest pain.  Chest pain seems musculoskeletal in nature and only present with coughing.  She is nontoxic-appearing here.  She has already received monoclonal antibodies and is outside of the window for antivirals.  She does have a PCP for close follow-up.  Discussed return precautions.  I feel she is safe to be discharged home without further emergent work-up.  Provided with work note so that she can be out of work for 10 full days.  Doubt ACS, PE, dissection, pneumonia, pneumothorax, volume overload, sepsis.  At this time, I do not feel there is any life-threatening condition present. I have reviewed, interpreted and discussed all results (EKG, imaging, lab, urine as appropriate) and exam findings with patient/family. I have reviewed nursing notes and appropriate previous records.  I feel the patient is safe to be discharged home without further emergent workup and can continue workup as an outpatient as needed. Discussed usual  and customary return precautions. Patient/family verbalize understanding and are comfortable with this plan.  Outpatient follow-up has been provided as needed. All questions have been answered.  ____________________________________________   FINAL CLINICAL IMPRESSION(S) / ED DIAGNOSES  Final diagnoses:  [id number]     ED Discharge Orders          Ordered    chlorpheniramine-HYDROcodone (TUSSIONEX PENNKINETIC ER) 10-8 MG/5ML SUER  Every 12 hours PRN        10/27/20 0247            *Please note:  Jemimah Gunst was evaluated in Emergency Department on 10/27/2020 for the symptoms described in the history of present illness. She was evaluated  in the context of the global COVID-19 pandemic, which necessitated consideration that the patient might be at risk for infection with the SARS-CoV-2 virus that causes COVID-19. Institutional protocols and algorithms that pertain to the evaluation of patients at risk for COVID-19 are in a state of rapid change based on information released by regulatory bodies including the CDC and federal and state organizations. These policies and algorithms were followed during the patient's care in the ED.  Some ED evaluations and interventions may be delayed as a result of limited staffing during and the pandemic.*   Note:  This document was prepared using Dragon voice recognition software and may include unintentional dictation errors.    Malon Branton, Delice Bison, DO 10/27/20 [place], Delice Bison, DO 10/27/20 [phone]

## 2020-10-27 NOTE — Discharge Instructions (Addendum)
You may alternate Tylenol 1000 mg every 6 hours as needed for pain, fever and Ibuprofen 800 mg every 8 hours as needed for pain, fever.  Please take Ibuprofen with food.  Do not take more than 4000 mg of Tylenol (acetaminophen) in a 24 hour period.    You are being provided a prescription for opiates (also known as narcotics) for pain control.  Opiates can be addictive and should only be used when absolutely necessary for pain control when other alternatives do not work.  We recommend you only use them for the recommended amount of time and only as prescribed.  Please do not take with other sedative medications or alcohol.  Please do not drive, operate machinery, make important decisions while taking opiates.  Please note that these medications can be addictive and have high abuse potential.  Patients can become addicted to narcotics after only taking them for a few days.  Please keep these medications locked away from children, teenagers or any family members with history of substance abuse.  Narcotic pain medicine may also make you constipated.  You may use over-the-counter medications such as MiraLAX, Colace to prevent constipation.  If you become constipated you may use over-the-counter enemas as needed.  Itching and nausea are common side effects of narcotic pain medication.  If you develop uncontrolled vomiting or a rash, please stop these medications.

## 2021-03-28 ENCOUNTER — Ambulatory Visit
Admission: EM | Admit: 2021-03-28 | Discharge: 2021-03-28 | Disposition: A | Discharge status: Home / Self Care | Payer: BLUE CROSS/BLUE SHIELD

## 2021-03-28 ENCOUNTER — Other Ambulatory Visit: Payer: Self-pay

## 2021-03-28 DIAGNOSIS — J069 Acute upper respiratory infection, unspecified: Secondary | ICD-10-CM

## 2021-03-28 MED ORDER — FLUCONAZOLE 150 MG PO TABS: 150.0000 mg | ORAL_TABLET | Freq: Every day | ORAL | 0 refills | Status: AC

## 2021-03-28 MED ORDER — DOXYCYCLINE HYCLATE 100 MG PO CAPS: 100.0000 mg | ORAL_CAPSULE | Freq: Two times a day (BID) | ORAL | 0 refills | Status: AC

## 2021-03-28 NOTE — Discharge Instructions (Addendum)
The Doxycycline twice daily with food for 10 days for treatment of your sinusitis.  Perform sinus irrigation 2-3 times a day with a [employer] sinus rinse kit and distilled water.  Do not use tap water.  You can use plain over-the-counter Mucinex every 6 hours to break up the stickiness of the mucus so your body can clear it.  May attempt over-the-counter antihistamine such as Claritin or Zyrtec to help reduce symptoms  Increase your oral fluid intake to thin out your mucus so that is also able for your body to clear more easily.   If you develop any new or worsening symptoms return for reevaluation or see your primary care provider.

## 2021-03-28 NOTE — ED Provider Notes (Signed)
[facility]    CSN: [id number] Arrival date & time: 03/28/21  4098      History   Chief Complaint Chief Complaint  Patient presents with   Nasal Congestion   Cough    HPI Elayne Gruver is a 46 y.o. female.   Patient presents with nasal congestion, mild nonproductive cough and malaise for 2 weeks.  Tolerating food and liquids.  No known sick contacts.  Has attempted use of humidifier, air purifier, Mucinex with no relief.  History of hypertension and endometrial cancer.   Past Medical History:  Diagnosis Date   Endometrial cancer ([place])    Hypertension     There are no problems to display for this patient.   Past Surgical History:  Procedure Laterality Date   ABDOMINAL HYSTERECTOMY     TONSILLECTOMY      OB History   No obstetric history on file.      Home Medications    Prior to Admission medications   Medication Sig Start Date End Date Taking? Authorizing Provider  albuterol (PROVENTIL HFA;VENTOLIN HFA) 108 (90 Base) MCG/ACT inhaler Inhale 2 puffs into the lungs every 6 (six) hours as needed for wheezing or shortness of breath. 10/05/17  Yes Nena Polio, MD  atorvastatin (LIPITOR) 80 MG tablet Take by mouth. 02/05/21 02/05/22 Yes [provider]  carvedilol (COREG) 25 MG tablet Take by mouth. 03/23/21  Yes [provider]  cyclobenzaprine (FLEXERIL) 10 MG tablet Take 1 tablet (10 mg total) by mouth 3 (three) times daily as needed. 01/14/20  Yes Sable Feil, PA-C  SEMGLEE, YFGN, 100 UNIT/ML Pen SMARTSIG:0.6 Milliliter(s) SUB-Q Daily 02/26/21  Yes [provider]  chlorpheniramine-HYDROcodone (TUSSIONEX PENNKINETIC ER) 10-8 MG/5ML SUER Take 5 mLs by mouth every 12 (twelve) hours as needed for cough. 10/27/20   Ward, Delice Bison, DO  traMADol (ULTRAM) 50 MG tablet Take 1 tablet (50 mg total) by mouth every 6 (six) hours as needed for moderate pain. 01/14/20   Sable Feil, PA-C    Family History History reviewed. No  pertinent family history.  Social History Social History   Tobacco Use   Smoking status: Never   Smokeless tobacco: Never  Vaping Use   Vaping Use: Never used  Substance Use Topics   Alcohol use: No   Drug use: No     Allergies   Eggs or egg-derived products, Amoxicillin-pot clavulanate, Prednisone, Shellfish-derived products, and Peanut-containing drug products   Review of Systems Review of Systems  Constitutional:  Negative for activity change, appetite change, chills, diaphoresis, fatigue, fever and unexpected weight change.  HENT:  Positive for congestion. Negative for dental problem, drooling, ear discharge, ear pain, facial swelling, hearing loss, mouth sores, nosebleeds, postnasal drip, rhinorrhea, sinus pressure, sinus pain, sneezing, sore throat, tinnitus, trouble swallowing and voice change.   Respiratory:  Positive for cough. Negative for apnea, choking, chest tightness, shortness of breath, wheezing and stridor.   Gastrointestinal: Negative.   Skin: Negative.   Neurological: Negative.     Physical Exam Triage Vital Signs ED Triage Vitals  Enc Vitals Group     BP 03/28/21 0847 (!) 148/104     Pulse Rate 03/28/21 0847 96     Resp 03/28/21 0847 18     Temp 03/28/21 0847 98.3 F (36.8 C)     Temp Source 03/28/21 0847 Oral     SpO2 03/28/21 0847 98 %     Weight 03/28/21 0839 274 lb (124.3 kg)  Height 03/28/21 0839 5\' 3"  (1.6 m)     Head Circumference --      Peak Flow --      Pain Score 03/28/21 0838 0     Pain Loc --      Pain Edu? --      Excl. in [place]? --    No data found.  Updated Vital Signs BP (!) 148/104 (BP Location: Left Arm)    Pulse 96    Temp 98.3 F (36.8 C) (Oral)    Resp 18    Ht 5\' 3"  (1.6 m)    Wt 274 lb (124.3 kg)    SpO2 98%    BMI 48.54 kg/m   Visual Acuity Right Eye Distance:   Left Eye Distance:   Bilateral Distance:    Right Eye Near:   Left Eye Near:    Bilateral Near:     Physical Exam Constitutional:       Appearance: Normal appearance.  HENT:     Head: Normocephalic.     Right Ear: Hearing, ear canal and external ear normal. A middle ear effusion is present.     Left Ear: Hearing, tympanic membrane, ear canal and external ear normal.     Nose: Congestion present. No rhinorrhea.     Mouth/Throat:     Mouth: Mucous membranes are moist.     Pharynx: Posterior oropharyngeal erythema present.  Eyes:     Extraocular Movements: Extraocular movements intact.  Cardiovascular:     Rate and Rhythm: Normal rate and regular rhythm.     Pulses: Normal pulses.     Heart sounds: Normal heart sounds.  Pulmonary:     Effort: Pulmonary effort is normal.     Breath sounds: Normal breath sounds.  Musculoskeletal:     Cervical back: Normal range of motion and neck supple.  Skin:    General: Skin is warm and dry.  Neurological:     Mental Status: She is alert and oriented to person, place, and time. Mental status is at baseline.  Psychiatric:        Mood and Affect: Mood normal.        Behavior: Behavior normal.     UC Treatments / Results  Labs (all labs ordered are listed, but only abnormal results are displayed) Labs Reviewed - No data to display  EKG   Radiology No results found.  Procedures Procedures (including critical care time)  Medications Ordered in UC Medications - No data to display  Initial Impression / Assessment and Plan / UC Course  I have reviewed the triage vital signs and the nursing notes.  Pertinent labs & imaging results that were available during my care of the patient were reviewed by me and considered in my medical decision making (see chart for details).  Upper respiratory infection  Due to timeline of symptoms will attempt use of antibiotic to cover for bacteria, doxycycline 7-day course prescribed, allergy to penicillin confirmed, over-the-counter medications for symptom management, recommended continued use of humidifier and air purifier, urgent care PCP  follow-up as needed Final Clinical Impressions(s) / UC Diagnoses   Final diagnoses:  None   Discharge Instructions   None    ED Prescriptions   None    PDMP not reviewed this encounter.   Hans [place], [place] 03/28/21 [phone]

## 2021-03-28 NOTE — ED Triage Notes (Signed)
Pt c/o dark mucus, cough, sore throat, congestion x2weeks. Pt states that she knows she has an infection.

## 2022-05-07 ENCOUNTER — Other Ambulatory Visit: Payer: Self-pay

## 2022-05-07 ENCOUNTER — Emergency Department
Admission: EM | Admit: 2022-05-07 | Discharge: 2022-05-07 | Disposition: A | Discharge status: Home / Self Care | Payer: BLUE CROSS/BLUE SHIELD | Attending: Emergency Medicine | Admitting: Emergency Medicine

## 2022-05-07 DIAGNOSIS — H9313 Tinnitus, bilateral: Secondary | ICD-10-CM | POA: Diagnosis present

## 2022-05-07 NOTE — ED Triage Notes (Signed)
Reports bilateral ear ringing and sinus congestion x 1 wk. Reports hx of tinnitus. Pt ambulatory to triage. Breathing unlabored speaking in full sentences with symmetric chest rise and fall. Pt alert and oriented.

## 2022-05-07 NOTE — ED Notes (Signed)
ED Provider at bedside. 

## 2022-05-07 NOTE — ED Provider Notes (Signed)
   [facility] Provider Note    Event Date/Time   First MD Initiated Contact with Patient 05/07/22 [phone]     (approximate)   History   Tinnitus   HPI  Angelica Gonzalez is a 47 y.o. female who presents with complaints of bilateral tinnitus.  Patient reports this has been going on for weeks.  She reports that in the past she has had this and she had scheduled an appointment with a specialist but the day that she arrived for the appointment her symptoms had resolved.  The having gone for quite some time before restarting several weeks ago.  Otherwise no significant complaints     Physical Exam   Triage Vital Signs: ED Triage Vitals  Enc Vitals Group     BP 05/07/22 0538 (!) 158/105     Pulse Rate 05/07/22 0538 84     Resp 05/07/22 0538 18     Temp 05/07/22 0538 98.4 F (36.9 C)     Temp Source 05/07/22 0538 Oral     SpO2 05/07/22 0538 100 %     Weight 05/07/22 0537 122.5 kg (270 lb)     Height 05/07/22 0537 1.6 m ('5\' 3"'$ )     Head Circumference --      Peak Flow --      Pain Score 05/07/22 0537 4     Pain Loc --      Pain Edu? --      Excl. in [place]? --     Most recent vital signs: Vitals:   05/07/22 0538  BP: (!) 158/105  Pulse: 84  Resp: 18  Temp: 98.4 F (36.9 C)  SpO2: 100%     General: Awake, no distress.  CV:  Good peripheral perfusion.  Resp:  Normal effort.  Abd:  No distention.  Other:  Ears: Normal exam bilaterally,  No neurodeficits  ED Results / Procedures / Treatments   Labs (all labs ordered are listed, but only abnormal results are displayed) Labs Reviewed - No data to display   EKG     RADIOLOGY     PROCEDURES:  Critical Care performed:   Procedures   MEDICATIONS ORDERED IN ED: Medications - No data to display   IMPRESSION / MDM / [place] / ED COURSE  I reviewed the triage vital signs and the nursing notes. Patient's presentation is most consistent with acute, uncomplicated  illness.  Patient presents with tinnitus, no red flag symptoms, no new medications, no aspirin or salicylate usage.  Blood pressure mildly elevated here likely related to anxiety.  No indication for emergent imaging or workup at this time, outpatient follow-up recommended        FINAL CLINICAL IMPRESSION(S) / ED DIAGNOSES   Final diagnoses:  Tinnitus of both ears     Rx / DC Orders   ED Discharge Orders     None        Note:  This document was prepared using Dragon voice recognition software and may include unintentional dictation errors.   Lavonia Drafts, MD 05/07/22 [phone]

## 2023-02-27 IMAGING — CR DG CHEST 2V
2 series · 2 of 2 positions shown · non-contrast
Comparison: 10/05/2017

CLINICAL DATA: Cough

EXAM:
CHEST - 2 VIEW

[chest pa]
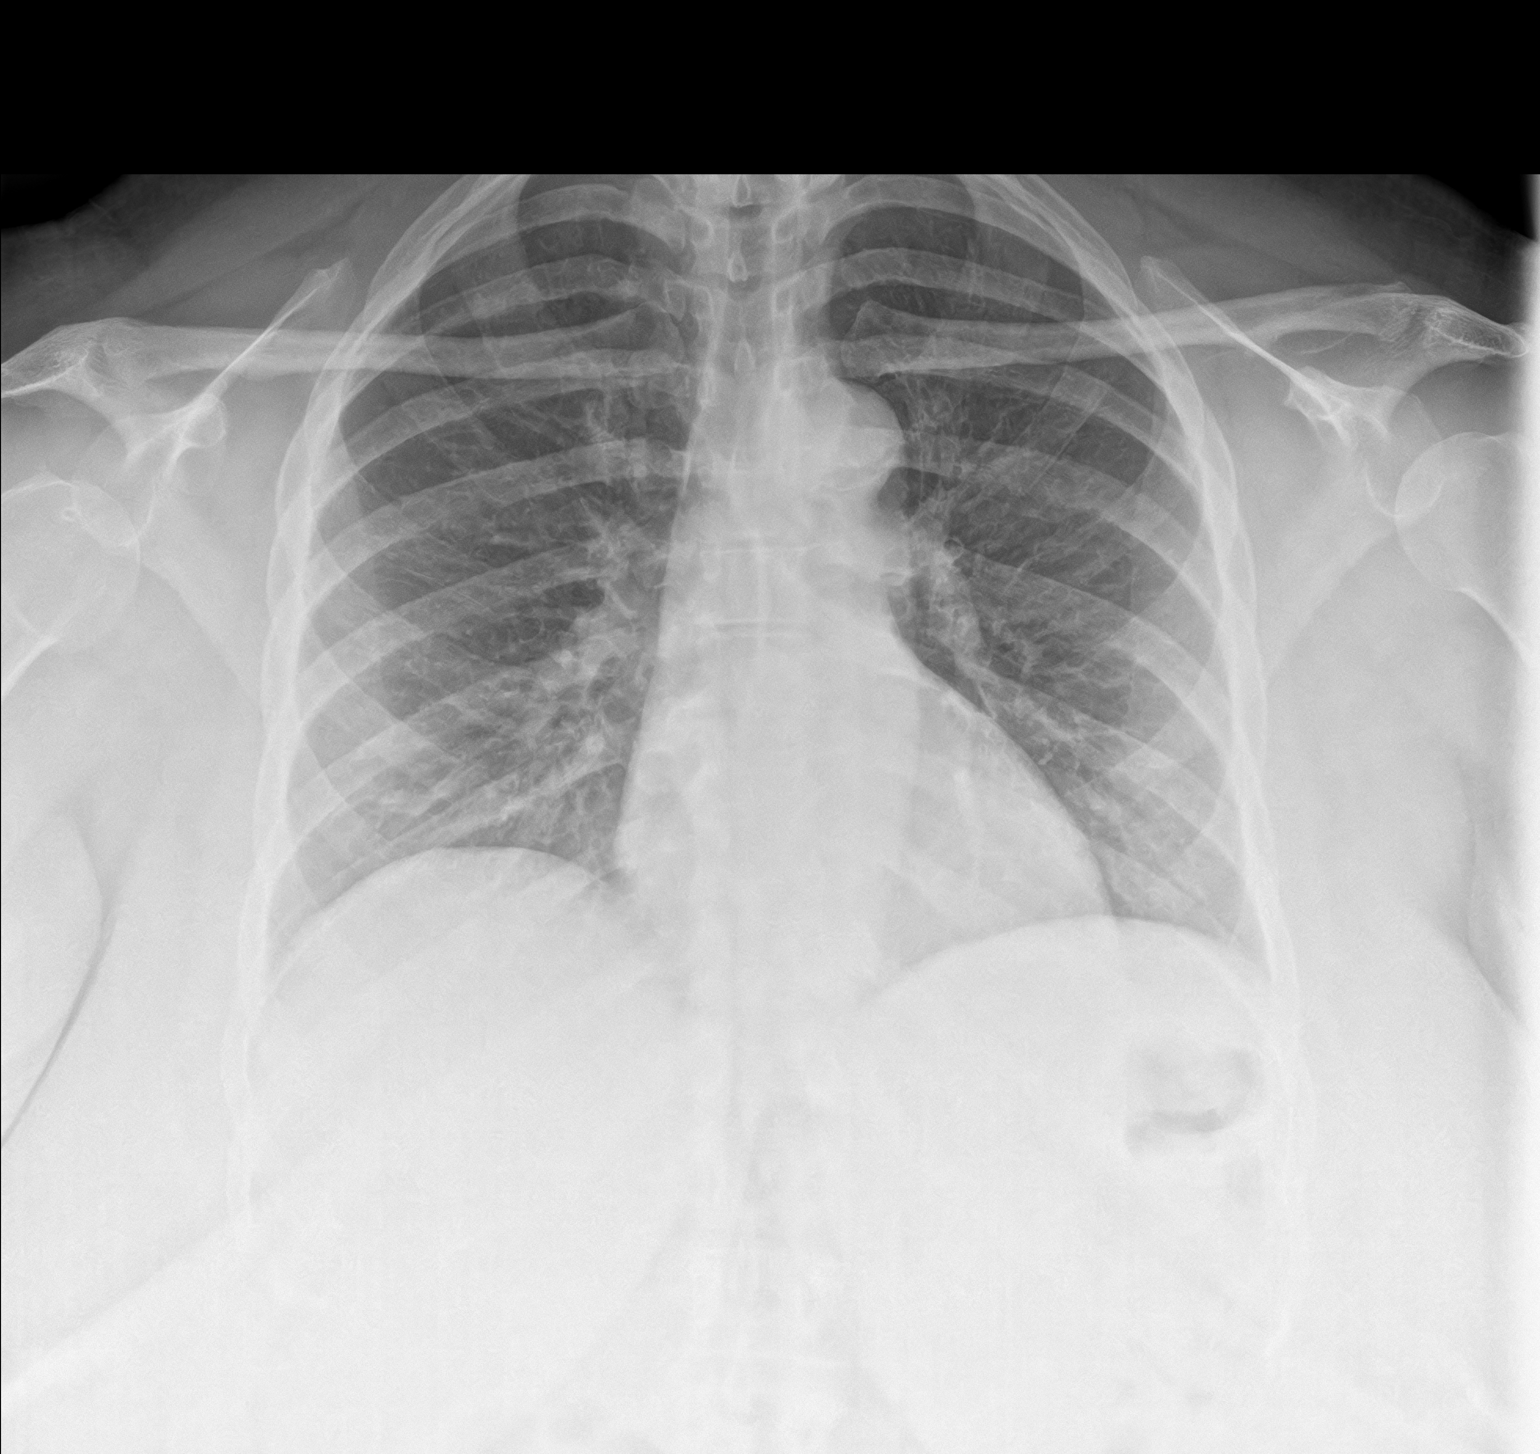

[chest lat]
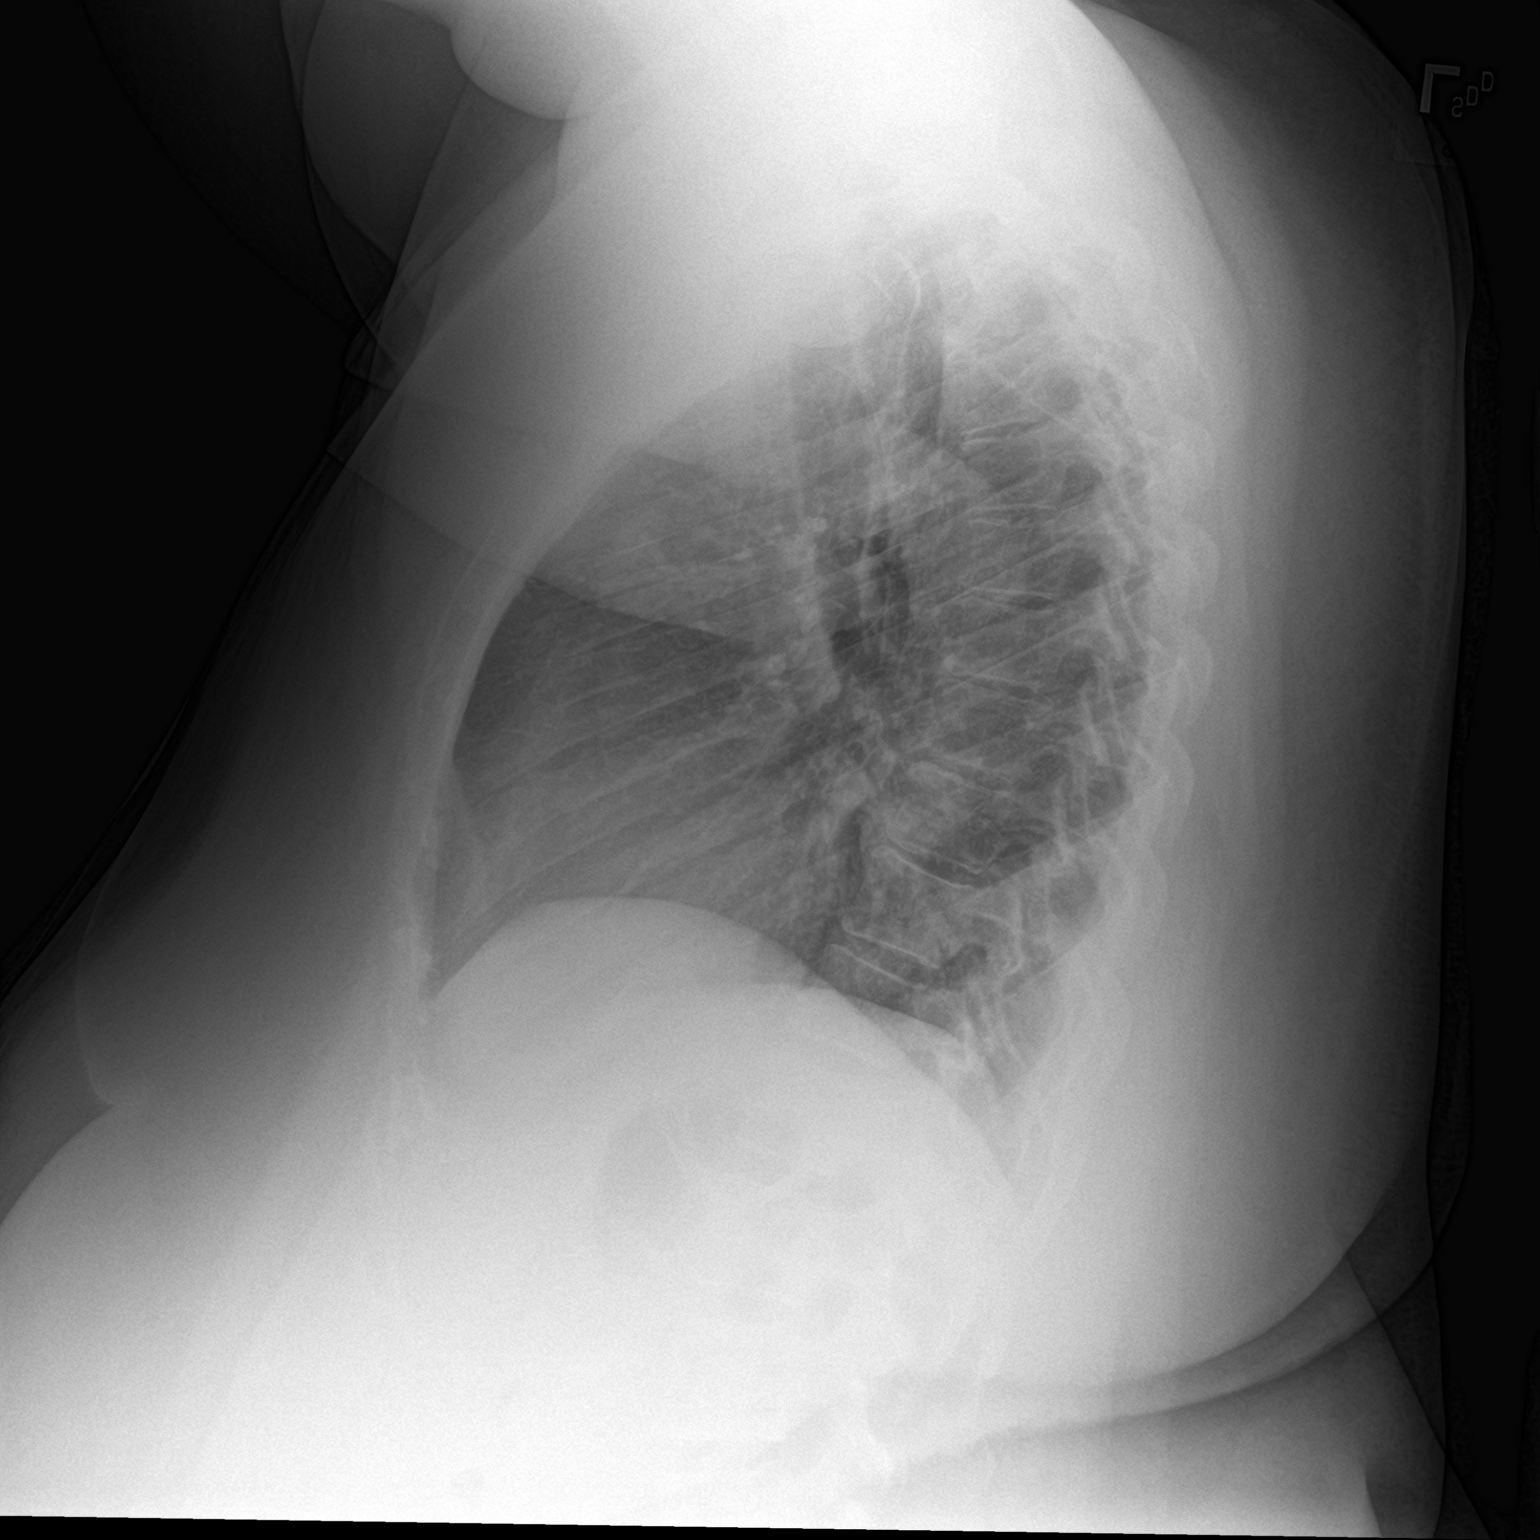

[2 of 2 positions shown; findings below may reference images not displayed]

FINDINGS: The heart size and mediastinal contours are within normal limits.
Both lungs are clear. The visualized skeletal structures are
unremarkable.
IMPRESSION: No active cardiopulmonary disease.

## 2023-05-19 ENCOUNTER — Emergency Department
Admission: EM | Admit: 2023-05-19 | Discharge: 2023-05-19 | Disposition: A | Discharge status: Home / Self Care | Attending: Emergency Medicine | Admitting: Emergency Medicine

## 2023-05-19 ENCOUNTER — Other Ambulatory Visit: Payer: Self-pay

## 2023-05-19 DIAGNOSIS — T7840XA Allergy, unspecified, initial encounter: Secondary | ICD-10-CM | POA: Insufficient documentation

## 2023-05-19 DIAGNOSIS — I10 Essential (primary) hypertension: Secondary | ICD-10-CM | POA: Insufficient documentation

## 2023-05-19 DIAGNOSIS — R0602 Shortness of breath: Secondary | ICD-10-CM | POA: Diagnosis present

## 2023-05-19 MED ORDER — ONDANSETRON HCL 4 MG/2ML IJ SOLN
4.0000 mg | Freq: Once | INTRAMUSCULAR | Status: AC
Start: 2023-05-19 — End: 2023-05-19
  Administered 2023-05-19: 4 mg via INTRAVENOUS
  Filled 2023-05-19: qty 2

## 2023-05-19 MED ORDER — EPINEPHRINE 0.3 MG/0.3ML IJ SOAJ: 0.3000 mg | INTRAMUSCULAR | 0 refills | Status: AC | PRN

## 2023-05-19 MED ORDER — EPINEPHRINE 0.3 MG/0.3ML IJ SOAJ
0.3000 mg | Freq: Once | INTRAMUSCULAR | Status: AC
  Administered 2023-05-19: 0.3 mg via INTRAMUSCULAR

## 2023-05-19 MED ORDER — FAMOTIDINE IN NACL 20-0.9 MG/50ML-% IV SOLN
20.0000 mg | Freq: Once | INTRAVENOUS | Status: AC
  Administered 2023-05-19: 20 mg via INTRAVENOUS
  Filled 2023-05-19: qty 50

## 2023-05-19 MED ORDER — DIPHENHYDRAMINE HCL 50 MG/ML IJ SOLN
25.0000 mg | Freq: Once | INTRAMUSCULAR | Status: AC
  Administered 2023-05-19: 25 mg via INTRAVENOUS
  Filled 2023-05-19: qty 1

## 2023-05-19 MED ORDER — EPINEPHRINE 0.3 MG/0.3ML IJ SOAJ
INTRAMUSCULAR | Status: AC
  Filled 2023-05-19: qty 0.3

## 2023-05-19 NOTE — ED Notes (Signed)
 Patient's iv came out during infusion. Pt was only able to get half of her famotidine infusion. MD Siadecki notified, and advised this RN that another attempt at iv access isn't necessary. Patient status improving, and will continue to monitor.

## 2023-05-19 NOTE — ED Provider Notes (Signed)
 [facility] Provider Note    Event Date/Time   First MD Initiated Contact with Patient 05/19/23 0710     (approximate)   History   Allergic Reaction   HPI  Angelica Gonzalez is a 48 y.o. female with a history of hypertension and anaphylaxis who presents with an apparent allergic reaction, acute onset this morning after eating something at [employer].  She is allergic to eggs, shellfish, and nuts.  The patient is speaking in a whispering voice and reports shortness of breath and coughing.  She has had similar allergic reactions previously that have been successfully treated with an EpiPen.  I reviewed the past medical records.  The most recent prior encounter ICU for anaphylaxis was in 2016 when the patient was treated with an EpiPen, Pepcid, and Benadryl.  She is allergic to steroids.  Her recent outpatient encounter was with cardiology yesterday for healthcare maintenance visit yesterday.   Physical Exam   Triage Vital Signs: ED Triage Vitals  Encounter Vitals Group     BP --      Systolic BP Percentile --      Diastolic BP Percentile --      Pulse Rate 05/19/23 0713 69     Resp 05/19/23 0713 20     Temp 05/19/23 0718 97.9 F (36.6 C)     Temp src --      SpO2 05/19/23 0713 100 %     Weight 05/19/23 0714 268 lb 15.4 oz (122 kg)     Height 05/19/23 0714 5\' 3"  (1.6 m)     Head Circumference --      Peak Flow --      Pain Score 05/19/23 0714 0     Pain Loc --      Pain Education --      Exclude from Growth Chart --     Most recent vital signs: Vitals:   05/19/23 1000 05/19/23 1015  BP:  124/77  Pulse: 83 78  Resp: 14 14  Temp:    SpO2: 100% 100%    General: Alert, uncomfortable appearing, no distress.  CV:  Good peripheral perfusion.  Resp:  Normal effort.  Lungs CTAB. Abd:  No distention.  Other:  Speaking whispering voice, coughing.  No stridor.  No pooled secretions.  No rash or hives.   ED Results / Procedures / Treatments    Labs (all labs ordered are listed, but only abnormal results are displayed) Labs Reviewed - No data to display   EKG    RADIOLOGY    PROCEDURES:  Critical Care performed: Yes, see critical care procedure note(s)  .Critical Care  Performed by: Dionne Bucy, MD Authorized by: Dionne Bucy, MD   Critical care provider statement:    Critical care time (minutes):  30   Critical care time was exclusive of:  Separately billable procedures and treating other patients   Critical care was necessary to treat or prevent imminent or life-threatening deterioration of the following conditions: anaphylaxis.   Critical care was time spent personally by me on the following activities:  Development of treatment plan with patient or surrogate, discussions with consultants, evaluation of patient's response to treatment, examination of patient, ordering and review of laboratory studies, ordering and review of radiographic studies, ordering and performing treatments and interventions, pulse oximetry, re-evaluation of patient's condition, review of old charts and obtaining history from patient or surrogate    MEDICATIONS ORDERED IN ED: Medications  EPINEPHrine (EPI-PEN) injection 0.3 mg (0.3  mg Intramuscular See Procedure Record 05/19/23 0729)  diphenhydrAMINE (BENADRYL) injection 25 mg (25 mg Intravenous Given 05/19/23 0723)  famotidine (PEPCID) IVPB 20 mg premix (0 mg Intravenous Stopped 05/19/23 0741)  ondansetron (ZOFRAN) injection 4 mg (4 mg Intravenous Given 05/19/23 0725)  EPINEPHrine (EPI-PEN) injection 0.3 mg (0.3 mg Intramuscular Given 05/19/23 0727)     IMPRESSION / MDM / ASSESSMENT AND PLAN / ED COURSE  I reviewed the triage vital signs and the nursing notes.  48 year old female with PMH as noted above presents with symptoms of an apparent allergic reaction to food.  On exam, the patient is uncomfortable appearing, speaking in a whispering voice, actively coughing, with  increased work of breathing and intermittent spitting up/vomiting.  However lungs are clear to auscultation, O2 saturation is 100% on room air, and there are no hives or rash.  There is no visible swelling in the oropharynx, or to the mouth or tongue.  Differential diagnosis includes, but is not limited to, allergic reaction, anaphylaxis, angioedema.  The patient is on the monitor.  An EpiPen was given.  Will establish IV access and give Pepcid and Benadryl, avoiding steroids.  Patient's presentation is most consistent with acute presentation with potential threat to life or bodily function.  The patient is on the cardiac monitor to evaluate for evidence of arrhythmia and/or significant heart rate changes.  ----------------------------------------- 7:31 AM on 05/19/2023 -----------------------------------------  The patient was still symptomatic although slightly started improve after 15 minutes so a second EpiPen was given.  The patient states that she has often had to have this in the past.  IV access is established.  ----------------------------------------- 10:48 AM on 05/19/2023 -----------------------------------------  The patient rapidly improved after the second epinephrine dose and other medications were given.  She has now been asymptomatic for the last 3+ hours, with no rebound symptoms.  She feels well and would like to go home.  She is stable for discharge at this time.  I have prescribed her a new EpiPen.  I gave strict return precautions and she expressed understanding.   FINAL CLINICAL IMPRESSION(S) / ED DIAGNOSES   Final diagnoses:  Allergic reaction, initial encounter     Rx / DC Orders   ED Discharge Orders          Ordered    EPINEPHrine 0.3 mg/0.3 mL IJ SOAJ injection  As needed        05/19/23 1048             Note:  This document was prepared using Dragon voice recognition software and may include unintentional dictation errors.    Dionne Bucy, MD 05/19/23 1049

## 2023-05-19 NOTE — ED Notes (Signed)
 Pt advocate luanne at bedside at this time speaking with patient.

## 2023-05-19 NOTE — ED Notes (Addendum)
 Pt here wit allergic reaction due to spicy foods, pt burping and has hoarse voice and stats her "throat is closing". Pt roomed to 5, epi pen given at 0712 R thigh. MD at bedside

## 2023-05-19 NOTE — ED Notes (Signed)
Patient's family member at bedside. 

## 2023-05-19 NOTE — ED Notes (Signed)
 Patient requesting to speak with pt advocate. Pt advocate Luanne notified.
